# Patient Record
Sex: Female | Born: 1970 | Marital: Married | Smoking: Never smoker
Health system: Southern US, Community
[De-identification: ages and names within clinical notes are randomized; demographics above are authoritative.]

## PROBLEM LIST (undated history)

## (undated) DIAGNOSIS — F419 Anxiety disorder, unspecified: Secondary | ICD-10-CM

## (undated) DIAGNOSIS — T7840XA Allergy, unspecified, initial encounter: Secondary | ICD-10-CM

## (undated) DIAGNOSIS — R0982 Postnasal drip: Secondary | ICD-10-CM

## (undated) HISTORY — DX: Postnasal drip: R09.82

## (undated) HISTORY — DX: Anxiety disorder, unspecified: F41.9

## (undated) HISTORY — DX: Allergy, unspecified, initial encounter: T78.40XA

## (undated) HISTORY — PX: ANTERIOR CRUCIATE LIGAMENT REPAIR: SHX115

---

## 2014-03-03 ENCOUNTER — Encounter: Payer: Self-pay | Admitting: Family Medicine

## 2014-03-03 ENCOUNTER — Ambulatory Visit (INDEPENDENT_AMBULATORY_CARE_PROVIDER_SITE_OTHER): Payer: Managed Care, Other (non HMO) | Admitting: Family Medicine

## 2014-03-03 VITALS — BP 100/70 | HR 65 | Temp 97.8°F | Ht 62.5 in | Wt 121.4 lb

## 2014-03-03 DIAGNOSIS — Z7689 Persons encountering health services in other specified circumstances: Secondary | ICD-10-CM

## 2014-03-03 DIAGNOSIS — F419 Anxiety disorder, unspecified: Principal | ICD-10-CM

## 2014-03-03 DIAGNOSIS — Z23 Encounter for immunization: Secondary | ICD-10-CM

## 2014-03-03 DIAGNOSIS — Z7189 Other specified counseling: Secondary | ICD-10-CM

## 2014-03-03 DIAGNOSIS — F418 Other specified anxiety disorders: Secondary | ICD-10-CM

## 2014-03-03 DIAGNOSIS — R202 Paresthesia of skin: Secondary | ICD-10-CM

## 2014-03-03 DIAGNOSIS — F329 Major depressive disorder, single episode, unspecified: Secondary | ICD-10-CM

## 2014-03-03 DIAGNOSIS — N926 Irregular menstruation, unspecified: Secondary | ICD-10-CM

## 2014-03-03 DIAGNOSIS — F32A Depression, unspecified: Secondary | ICD-10-CM

## 2014-03-03 MED ORDER — VENLAFAXINE HCL ER 75 MG PO CP24: 75.0000 mg | ORAL_CAPSULE | Freq: Every day | ORAL | Status: DC

## 2014-03-03 NOTE — Progress Notes (Signed)
HPI:  April Perez is here to establish care. [name] but she and husbnad come here. Her PCP quit practicing about 1 year ago. Last PCP and physical:  Has the following chronic problems that require follow up and concerns today:  Pain in Hands: -on computer a lot - working more then usual -for several weeks has had some tingling, pain in thenar region, feeling weak in hands, R>L -denies: severe pain, fevers, symptoms elsewhere  Hx extreme GAD/depressive symptoms: -on effexor in the past -has had severe stress at work recently and this has been overwhelming -too much work and 44 year old that she feels like she does give her not Depression Symptoms: Sleep disorder: yes Interest deficit/anhedonia: yes Guilt (worthlessness, hopelessness, regret): yes Energy deficit: yes Concentration deficit: yes, feels like in a fog Appetite disorder: yes Psychomotor retardation or agitation: yes Suicidality: no Symptoms of GAD: Restlessness, on edge: yes Easily Fatigued: yes Difficulty concentrating: yes Irritability: yes Muscle tension yes Sleep Disturbance: yes -had counseling in the past  Irr Menstrual Bleeding: -reports sees gynecologist -irr periods  -denies: abd pain, fevers, weight loss  ROS negative for unless reported above: fevers, unintentional weight loss, hearing or vision loss, chest pain, palpitations, struggling to breath, hemoptysis, melena, hematochezia, hematuria, falls, loc, si, thoughts of self harm  Past Medical History  Diagnosis Date  . Anxiety   . Allergy   . Post-nasal drip     Past Surgical History  Procedure Laterality Date  . Anterior cruciate ligament repair Left     Family History  Problem Relation Age of Onset  . Osteoporosis Mother     and grandmother  . Hypertension Father   . Hyperlipidemia Mother   . Heart disease Maternal Grandfather     History   Social History  . Marital Status: Married     Spouse Name: N/A    Number of Children: N/A  . Years of Education: N/A   Social History Main Topics  . Smoking status: Never Smoker   . Smokeless tobacco: None  . Alcohol Use: None  . Drug Use: None  . Sexual Activity: None   Other Topics Concern  . 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  GENERAL: vitals reviewed and listed above, alert, oriented, appears well hydrated and in no acute distress  HEENT: atraumatic, conjunttiva clear, no obvious abnormalities on inspection of external nose and ears  NECK: no obvious masses on inspection  LUNGS: clear to auscultation bilaterally, no wheezes, rales or rhonchi, good air movement  CV: HRRR, no peripheral edema  MS/NEURO: moves all extremities without noticeable abnormality, CN II-XII groslly intact, finger to nose normal, visual acuity grossly intact, normal strength and sensation throughout on detailed exam of UEs bilaterally, normal DTRs, neg tinel's, phalens mildly +  R  PSYCH: pleasant and cooperative, no obvious depression or anxiety  ASSESSMENT AND PLAN:  Discussed the following assessment and plan:  Anxiety and depression -restart effexor as this worked well for her -advised CBT and exercise as well -she is looking for a new job and this may help -follow up 1 month or sooner as needed  Menstrual periods irregular -advised to schedule visit with her gynecologist Encounter to establish  care  Paresthesia of both hands -I suspect this is MS and/or CTS related to increased work load -cock up brace given for R hand - fitted and applied -follow up 1 month  She is not fasting today but wants to do FASTING labs - will do TSH, lipids, cbc, HgbA1c next visit  -We reviewed the PMH, PSH, FH, SH, Meds and Allergies. -We provided refills for any medications we will prescribe as needed. -We addressed current concerns per orders and patient instructions. -We have asked for records for pertinent exams, studies, vaccines and notes from previous providers. -We have advised patient to follow up per instructions below.   -Patient advised to return or notify a doctor immediately if symptoms worsen or persist or new concerns arise.  Patient Instructions  BEFORE YOU LEAVE: -follow up in 1 month - come fasting and we will check labs that day -flu vaccine today -medium R cock up brace  Please schedule visit with your gynecologist  FOR THE ANXIETY AND DEPRESSION: -start the Effexor 75mg  daily -schedule counseling -get regular exercise  We recommend the following healthy lifestyle measures: - eat a healthy diet consisting of lots of vegetables, fruits, beans, nuts, seeds, healthy meats such as white chicken and fish and whole grains.  - avoid fried foods, fast food, processed foods, sodas, red meet and other fattening foods.  - get a least 150 minutes of aerobic exercise per week.       Kriste BasqueKIM, Vannie Hochstetler R.

## 2014-03-03 NOTE — Patient Instructions (Addendum)
BEFORE YOU LEAVE: -follow up in 1 month - come fasting and we will check labs that day -flu vaccine today -medium R cock up brace  Please schedule visit with your gynecologist  FOR THE ANXIETY AND DEPRESSION: -start the Effexor 75mg  daily -schedule counseling -get regular exercise  We recommend the following healthy lifestyle measures: - eat a healthy diet consisting of lots of vegetables, fruits, beans, nuts, seeds, healthy meats such as white chicken and fish and whole grains.  - avoid fried foods, fast food, processed foods, sodas, red meet and other fattening foods.  - get a least 150 minutes of aerobic exercise per week.

## 2014-03-03 NOTE — Addendum Note (Signed)
Addended by: Johnella MoloneyFUNDERBURK, JO A on: 03/03/2014 10:33 AM   Modules accepted: Orders

## 2014-03-03 NOTE — Progress Notes (Signed)
Pre visit review using our clinic review tool, if applicable. No additional management support is needed unless otherwise documented below in the visit note. 

## 2014-04-07 ENCOUNTER — Ambulatory Visit (INDEPENDENT_AMBULATORY_CARE_PROVIDER_SITE_OTHER): Payer: Managed Care, Other (non HMO) | Admitting: Family Medicine

## 2014-04-07 ENCOUNTER — Ambulatory Visit: Payer: Managed Care, Other (non HMO) | Admitting: Family Medicine

## 2014-04-07 ENCOUNTER — Encounter: Payer: Self-pay | Admitting: Family Medicine

## 2014-04-07 VITALS — BP 96/70 | HR 73 | Temp 97.3°F | Ht 62.5 in | Wt 119.8 lb

## 2014-04-07 DIAGNOSIS — R300 Dysuria: Secondary | ICD-10-CM

## 2014-04-07 DIAGNOSIS — F419 Anxiety disorder, unspecified: Principal | ICD-10-CM

## 2014-04-07 DIAGNOSIS — F418 Other specified anxiety disorders: Secondary | ICD-10-CM

## 2014-04-07 DIAGNOSIS — Z13220 Encounter for screening for lipoid disorders: Secondary | ICD-10-CM

## 2014-04-07 DIAGNOSIS — G5601 Carpal tunnel syndrome, right upper limb: Secondary | ICD-10-CM

## 2014-04-07 DIAGNOSIS — F329 Major depressive disorder, single episode, unspecified: Secondary | ICD-10-CM

## 2014-04-07 DIAGNOSIS — Z131 Encounter for screening for diabetes mellitus: Secondary | ICD-10-CM

## 2014-04-07 LAB — POCT URINALYSIS DIPSTICK
BILIRUBIN UA: NEGATIVE
Blood, UA: NEGATIVE
GLUCOSE UA: NEGATIVE
NITRITE UA: POSITIVE
SPEC GRAV UA: 1.02
Urobilinogen, UA: 0.2
pH, UA: 8.5

## 2014-04-07 LAB — LIPID PANEL
CHOLESTEROL: 193 mg/dL (ref 0–200)
HDL: 48.6 mg/dL (ref >39.00)
LDL Cholesterol: 126 mg/dL — ABNORMAL HIGH (ref 0–99)
NonHDL: 144.4
Total CHOL/HDL Ratio: 4
Triglycerides: 90 mg/dL (ref 0.0–149.0)
VLDL: 18 mg/dL (ref 0.0–40.0)

## 2014-04-07 LAB — HEMOGLOBIN A1C: Hgb A1c MFr Bld: 5.2 % (ref 4.6–6.5)

## 2014-04-07 NOTE — Progress Notes (Signed)
Pre visit review using our clinic review tool, if applicable. No additional management support is needed unless otherwise documented below in the visit note. 

## 2014-04-07 NOTE — Patient Instructions (Signed)
BEFORE YOU LEAVE: -urine dip and culture if abnormal -schedule follow up in 3 months -labs  We recommend the following healthy lifestyle measures: - eat a healthy diet consisting of lots of vegetables, fruits, beans, nuts, seeds, healthy meats such as white chicken and fish and whole grains.  - avoid fried foods, fast food, processed foods, sodas, red meet and other fattening foods.  - get a least 150 minutes of aerobic exercise per week.   Consider counseling

## 2014-04-07 NOTE — Addendum Note (Signed)
Addended by: Johnella MoloneyFUNDERBURK, JO A on: 04/07/2014 08:22 AM   Modules accepted: Orders

## 2014-04-07 NOTE — Addendum Note (Signed)
Addended by: Johnella MoloneyFUNDERBURK, Alizaya Oshea A on: 04/07/2014 08:24 AM   Modules accepted: Orders

## 2014-04-07 NOTE — Progress Notes (Signed)
HPI:   Pain in Hands: -cock up brace for R given 02/2014 - improved significantly but still has some days with this if does not wear brace -on computer a lot - working more then usual - tingling, pain in thenar region, feeling weak in hands, R>L  -denies: severe pain, fevers, symptoms elsewhere  GAD/depressive symptoms: -on effexor in the past, restarted 02/2014 - reports better -stress at work -feels guilt that does not give enough time to daughter Depression Symptoms: Sleep disorder: no Interest deficit/anhedonia: no Guilt (worthlessness, hopelessness, regret): no Energy deficit: no Concentration deficit: no Appetite disorder: no Psychomotor retardation or agitation: no Suicidality: no Symptoms of GAD: Restlessness, on edge: improved Easily Fatigued: no Difficulty concentrating: no Irritability: no Muscle tension improved Sleep Disturbance: improved -had counseling in the past  Dysuria: -started 1 week ago -frequency, urgency, odor to urine, low back pain a little -denies: fevers, nausea, vomiting, hematuria -FDLMP: 2 weeks ago  Irr Menstrual Bleeding: -reports sees gynecologist -irr periods, resolved -denies: abd pain, fevers, weight loss  ROS: See pertinent positives and negatives per HPI.  Past Medical History  Diagnosis Date  . Anxiety   . Allergy   . Post-nasal drip     Past Surgical History  Procedure Laterality Date  . Anterior cruciate ligament repair Left     Family History  Problem Relation Age of Onset  . Osteoporosis Mother     and grandmother  . Hypertension Father   . Hyperlipidemia Mother   . Heart disease Maternal Grandfather     History   Social History  . Marital Status: Married    Spouse Name: N/A    Number of Children: N/A  . Years of Education: N/A   Social History Main Topics  . Smoking status: Never Smoker   . Smokeless tobacco: None  . Alcohol Use: None  . Drug Use: None  . Sexual Activity: None   Other Topics  Concern  . 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  venlafaxine XR (EFFEXOR XR) 75 MG 24 hr capsule, Take 1 capsule (75 mg total) by mouth daily with breakfast., Disp: 30 capsule, Rfl: 3  EXAM:  Filed Vitals:   04/07/14 0804  BP: 96/70  Pulse: 73  Temp: 97.3 F (36.3 C)    Body mass index is 21.55 kg/(m^2).  GENERAL: vitals reviewed and listed above, alert, oriented, appears well hydrated and in no acute distress  HEENT: atraumatic, conjunttiva clear, no obvious abnormalities on inspection of external nose and ears  NECK: no obvious masses on inspection  LUNGS: clear to auscultation bilaterally, no wheezes, rales or rhonchi, good air movement  CV: HRRR, no peripheral edema  ABD: BS+, soft, NTTP, no CVA TTP  MS: moves all extremities without noticeable abnormality  PSYCH: pleasant and cooperative, no obvious depression or anxiety  ASSESSMENT AND PLAN:  Discussed the following assessment and plan:  Anxiety and depression -cont effexor, f/u in 3 month  Carpal tunnel syndrome of right wrist -continue cub as needed  Dysuria -udip, cx if needed, neg exam, f/u if persists  Screening for hyperlipidemia - Plan: Lipid panel  Screening for  diabetes mellitus - Plan: Hemoglobin A1c  -FASTING labs: lipids, hgba1c -Patient advised to return or notify a doctor immediately if symptoms worsen or persist or new concerns arise.  Patient Instructions  BEFORE YOU LEAVE: -urine dip and culture if abnormal -schedule follow up in 3 months -labs  We recommend the following healthy lifestyle measures: - eat a healthy diet consisting of lots of vegetables, fruits, beans, nuts, seeds, healthy meats such as white chicken and  fish and whole grains.  - avoid fried foods, fast food, processed foods, sodas, red meet and other fattening foods.  - get a least 150 minutes of aerobic exercise per week.   Consider counseling     Kriste Basque R.

## 2014-04-08 MED ORDER — NITROFURANTOIN MONOHYD MACRO 100 MG PO CAPS: 100.0000 mg | ORAL_CAPSULE | Freq: Two times a day (BID) | ORAL | Status: DC

## 2014-04-08 NOTE — Addendum Note (Signed)
Addended by: Johnella MoloneyFUNDERBURK, Quasim Doyon A on: 04/08/2014 12:49 PM   Modules accepted: Orders

## 2014-04-10 ENCOUNTER — Ambulatory Visit: Payer: Managed Care, Other (non HMO) | Admitting: Family Medicine

## 2014-04-10 LAB — URINE CULTURE

## 2014-07-02 ENCOUNTER — Other Ambulatory Visit: Payer: Self-pay | Admitting: Family Medicine

## 2014-07-07 ENCOUNTER — Ambulatory Visit: Payer: Managed Care, Other (non HMO) | Admitting: Family Medicine

## 2014-07-30 ENCOUNTER — Other Ambulatory Visit: Payer: Self-pay | Admitting: Family Medicine

## 2014-07-31 ENCOUNTER — Ambulatory Visit: Payer: Managed Care, Other (non HMO) | Admitting: Family Medicine

## 2014-08-05 ENCOUNTER — Ambulatory Visit: Payer: Managed Care, Other (non HMO) | Admitting: Family Medicine

## 2014-08-20 ENCOUNTER — Ambulatory Visit: Payer: Managed Care, Other (non HMO) | Admitting: Family Medicine

## 2014-09-17 ENCOUNTER — Ambulatory Visit: Payer: Managed Care, Other (non HMO) | Admitting: Family Medicine

## 2014-10-06 ENCOUNTER — Ambulatory Visit: Payer: Managed Care, Other (non HMO) | Admitting: Family Medicine

## 2014-10-08 ENCOUNTER — Ambulatory Visit (INDEPENDENT_AMBULATORY_CARE_PROVIDER_SITE_OTHER): Payer: Self-pay | Admitting: Family Medicine

## 2014-10-08 DIAGNOSIS — R69 Illness, unspecified: Secondary | ICD-10-CM

## 2014-10-08 NOTE — Progress Notes (Signed)
NO SHOW

## 2014-10-23 ENCOUNTER — Other Ambulatory Visit: Payer: Self-pay | Admitting: Family Medicine

## 2014-12-19 ENCOUNTER — Other Ambulatory Visit: Payer: Self-pay | Admitting: Family Medicine

## 2015-02-09 ENCOUNTER — Ambulatory Visit (INDEPENDENT_AMBULATORY_CARE_PROVIDER_SITE_OTHER): Payer: PRIVATE HEALTH INSURANCE | Admitting: Family Medicine

## 2015-02-09 ENCOUNTER — Encounter: Payer: Self-pay | Admitting: Family Medicine

## 2015-02-09 VITALS — BP 98/60 | HR 83 | Temp 98.7°F | Ht 62.5 in | Wt 127.7 lb

## 2015-02-09 DIAGNOSIS — Z23 Encounter for immunization: Secondary | ICD-10-CM | POA: Diagnosis not present

## 2015-02-09 DIAGNOSIS — R1013 Epigastric pain: Secondary | ICD-10-CM | POA: Diagnosis not present

## 2015-02-09 DIAGNOSIS — K219 Gastro-esophageal reflux disease without esophagitis: Secondary | ICD-10-CM | POA: Diagnosis not present

## 2015-02-09 NOTE — Progress Notes (Signed)
HPI: Acute visit for:  Epigastric discomfort: -she has a hx of GERD and was on medication in the past -she has had some constant epigastric pain for about 1 week -she went on a short cruise after this started and was eating and drinking food she does not usually have -she denies: nausea, vomiting, diarrhea, fevers, malaise, dysphagia, change in symptoms with meals, RUQ pain, melena, hematochezia, new exercise or activity - she is active riding horses  ROS: See pertinent positives and negatives per HPI.  Past Medical History  Diagnosis Date  . Anxiety   . Allergy   . Post-nasal drip     Past Surgical History  Procedure Laterality Date  . Anterior cruciate ligament repair Left     Family History  Problem Relation Age of Onset  . Osteoporosis Mother     and grandmother  . Hypertension Father   . Hyperlipidemia Mother   . Heart disease Maternal Grandfather     Social History   Social History  . Marital Status: Married    Spouse Name: N/A  . Number of Children: N/A  . Years of Education: N/A   Social History Main Topics  . Smoking status: Never Smoker   . Smokeless tobacco: None  . Alcohol Use: None  . Drug Use: None  . Sexual Activity: Not Asked   Other Topics Concern  . 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  EXAM:  Filed Vitals:   02/09/15 1608  BP: 98/60  Pulse: 83  Temp: 98.7 F (37.1 C)    Body mass index is 22.97 kg/(m^2).  GENERAL: vitals reviewed and listed above, alert, oriented, appears well hydrated and in no acute distress  HEENT: atraumatic, conjunttiva clear, no obvious abnormalities on inspection of external nose and ears  NECK: no obvious masses on  inspection  LUNGS: clear to auscultation bilaterally, no wheezes, rales or rhonchi, good air movement  CV: HRRR, no peripheral edema  ABD: BS+, soft, no rebound or guarding, no pain in RUQ, mild TTP in epigastric region  MS: moves all extremities without noticeable abnormality, minimal TTP in abd wall muscles, no hernia appreciated on exam with valsalva  PSYCH: pleasant and cooperative, no obvious depression or anxiety  ASSESSMENT AND PLAN:  Discussed the following assessment and plan:  Needs flu shot - Plan: Flu Vaccine QUAD 36+ mos PF IM (Fluarix & Fluzone Quad PF)  Gastroesophageal reflux disease, esophagitis presence not specified  Epigastric pain  -we discussed possible serious and likely etiologies, workup and treatment, treatment risks and return precautions - suspect GERD or muscle strain most likely -after this discussion, Revella opted for short course PPI and observatio -follow up advised in 2 weeks if any symptoms persist -of course, we advised Makinzi  to return or notify a doctor immediately if symptoms worsen or persist or new concerns arise.  .  -Patient advised to return or notify a doctor immediately if symptoms worsen or persist or new concerns arise.  Patient Instructions  Nexium  daily for 2 weeks (this is available over the counter)  Please follow up if symptoms persist after treatment or there are  any other concerns  Food Choices for Gastroesophageal Reflux Disease, Adult When you have gastroesophageal reflux disease (GERD), the foods you eat and your eating habits are very important. Choosing the right foods can help ease the discomfort of GERD. WHAT GENERAL GUIDELINES DO I NEED TO FOLLOW?  Choose fruits, vegetables, whole grains, low-fat dairy products, and low-fat meat, fish, and poultry.  Limit fats such as oils, salad dressings, butter, nuts, and avocado.  Keep a food diary to identify foods that cause symptoms.  Avoid foods that cause  reflux. These may be different for different people.  Eat frequent small meals instead of three large meals each day.  Eat your meals slowly, in a relaxed setting.  Limit fried foods.  Cook foods using methods other than frying.  Avoid drinking alcohol.  Avoid drinking large amounts of liquids with your meals.  Avoid bending over or lying down until 2-3 hours after eating. WHAT FOODS ARE NOT RECOMMENDED? The following are some foods and drinks that may worsen your symptoms: Vegetables Tomatoes. Tomato juice. Tomato and spaghetti sauce. Chili peppers. Onion and garlic. Horseradish. Fruits Oranges, grapefruit, and lemon (fruit and juice). Meats High-fat meats, fish, and poultry. This includes hot dogs, ribs, ham, sausage, salami, and bacon. Dairy Whole milk and chocolate milk. Sour cream. Cream. Butter. Ice cream. Cream cheese.  Beverages Coffee and tea, with or without caffeine. Carbonated beverages or energy drinks. Condiments Hot sauce. Barbecue sauce.  Sweets/Desserts Chocolate and cocoa. Donuts. Peppermint and spearmint. Fats and Oils High-fat foods, including [place] fries and potato chips. Other Vinegar. Strong spices, such as black pepper, white pepper, red pepper, cayenne, curry powder, cloves, ginger, and chili powder. The items listed above may not be a complete list of foods and beverages to avoid. Contact your dietitian for more information.   This information is not intended to replace advice given to you by your health care provider. Make sure you discuss any questions you have with your health care provider.   Document Released: 03/06/2005 Document Revised: 03/27/2014 Document Reviewed: 01/08/2013 Elsevier Interactive Patient Education [address]2016 Elsevier Inc.      Arroy[place] HondoKIM, FrancesvilleHANNAH R.

## 2015-02-09 NOTE — Patient Instructions (Addendum)
Nexium 20mg  daily for 2 weeks (this is available over the counter)  Please follow up if symptoms persist after treatment or there are any other concerns  Food Choices for Gastroesophageal Reflux Disease, Adult When you have gastroesophageal reflux disease (GERD), the foods you eat and your eating habits are very important. Choosing the right foods can help ease the discomfort of GERD. WHAT GENERAL GUIDELINES DO I NEED TO FOLLOW?  Choose fruits, vegetables, whole grains, low-fat dairy products, and low-fat meat, fish, and poultry.  Limit fats such as oils, salad dressings, butter, nuts, and avocado.  Keep a food diary to identify foods that cause symptoms.  Avoid foods that cause reflux. These may be different for different people.  Eat frequent small meals instead of three large meals each day.  Eat your meals slowly, in a relaxed setting.  Limit fried foods.  Cook foods using methods other than frying.  Avoid drinking alcohol.  Avoid drinking large amounts of liquids with your meals.  Avoid bending over or lying down until 2-3 hours after eating. WHAT FOODS ARE NOT RECOMMENDED? The following are some foods and drinks that may worsen your symptoms: Vegetables Tomatoes. Tomato juice. Tomato and spaghetti sauce. Chili peppers. Onion and garlic. Horseradish. Fruits Oranges, grapefruit, and lemon (fruit and juice). Meats High-fat meats, fish, and poultry. This includes hot dogs, ribs, ham, sausage, salami, and bacon. Dairy Whole milk and chocolate milk. Sour cream. Cream. Butter. Ice cream. Cream cheese.  Beverages Coffee and tea, with or without caffeine. Carbonated beverages or energy drinks. Condiments Hot sauce. Barbecue sauce.  Sweets/Desserts Chocolate and cocoa. Donuts. Peppermint and spearmint. Fats and Oils High-fat foods, including [place] fries and potato chips. Other Vinegar. Strong spices, such as black pepper, white pepper, red pepper, cayenne, curry powder,  cloves, ginger, and chili powder. The items listed above may not be a complete list of foods and beverages to avoid. Contact your dietitian for more information.   This information is not intended to replace advice given to you by your health care provider. Make sure you discuss any questions you have with your health care provider.   Document Released: 03/06/2005 Document Revised: 03/27/2014 Document Reviewed: 01/08/2013 Elsevier Interactive Patient Education [employer] Elsevier Inc.

## 2015-07-14 ENCOUNTER — Other Ambulatory Visit: Payer: Self-pay | Admitting: Otolaryngology

## 2015-07-14 DIAGNOSIS — H8023 Cochlear otosclerosis, bilateral: Secondary | ICD-10-CM

## 2015-07-22 ENCOUNTER — Other Ambulatory Visit: Payer: PRIVATE HEALTH INSURANCE

## 2015-07-23 ENCOUNTER — Ambulatory Visit
Admission: RE | Admit: 2015-07-23 | Discharge: 2015-07-23 | Disposition: A | Discharge status: Home / Self Care | Payer: 59 | Source: Ambulatory Visit | Attending: Otolaryngology | Admitting: Otolaryngology

## 2015-07-23 DIAGNOSIS — H8023 Cochlear otosclerosis, bilateral: Secondary | ICD-10-CM

## 2015-07-23 MED ORDER — GADOBENATE DIMEGLUMINE 529 MG/ML IV SOLN
10.0000 mL | Freq: Once | INTRAVENOUS | Status: AC | PRN
  Administered 2015-07-23: 10 mL via INTRAVENOUS

## 2017-11-03 IMAGING — MR MR HEAD WO/W CM
10 of 11 series · 31 of 48 positions shown · IV contrast (10ml multihance)
Comparison: No prior imaging studies are available.

CLINICAL DATA: LEFT-sided hearing loss. Symptoms began 6-8 months
ago. Suspected otosclerosis of otic capsule.

EXAM:
MRI HEAD WITHOUT AND WITH CONTRAST
TECHNIQUE: Multiplanar, multiecho pulse sequences of the brain and surrounding
structures were obtained without and with intravenous contrast.
CONTRAST:  10mL MULTIHANCE GADOBENATE DIMEGLUMINE 529 MG/ML IV SOLN

[Series 2: T1 · sagittal · 5.0mm · 0.45mm/px · 4 of 21 slices shown (1 of 3)]
[im 1/21]
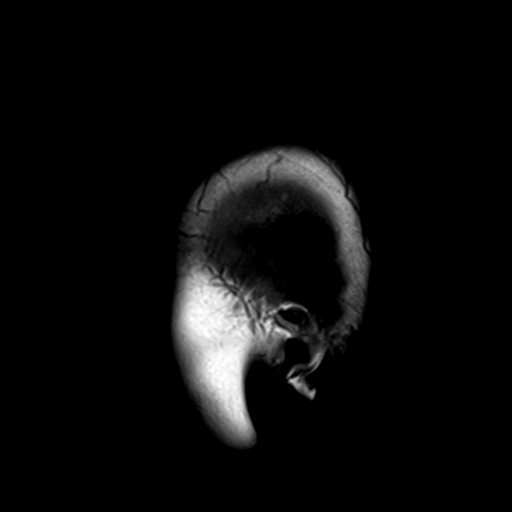
[im 7/21]
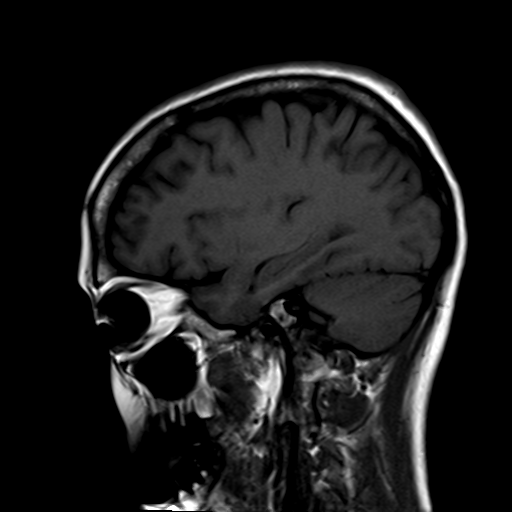
[im 14/21]
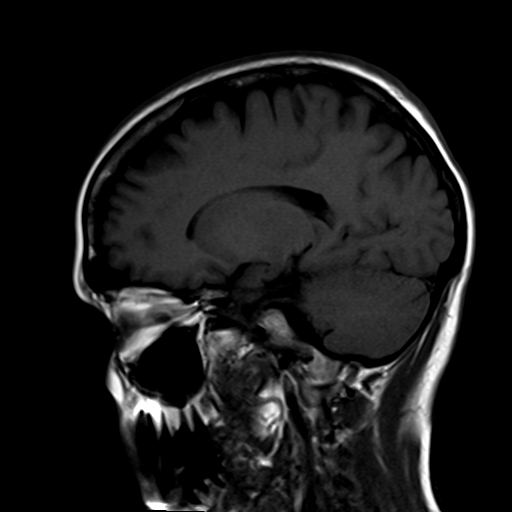
[im 21/21]
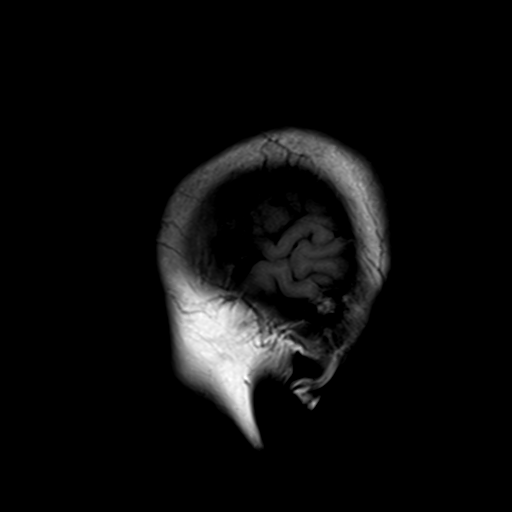

[Series 3: DWI · axial · 3.0mm · 1.80mm/px · z∈[+7,+150]mm · 8 of 97 slices shown (1 of 2)]
[im 1/97]
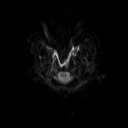
[im 15/97]
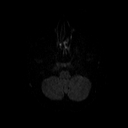
[im 30/97]
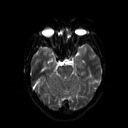
[im 45/97]
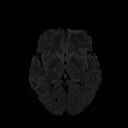
[im 52/97]
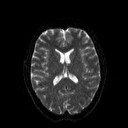
[im 67/97]
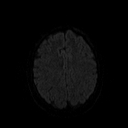
[im 82/97]
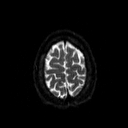
[im 97/97]
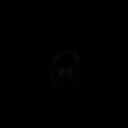

[Series 4: DWI · axial · 3.0mm · 1.80mm/px · z∈[+4,+150]mm · 6 of 49 slices shown (2 of 2)]
[im 1/49]
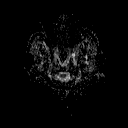
[im 10/49]
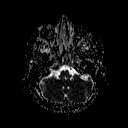
[im 20/49]
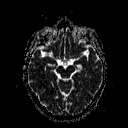
[im 29/49]
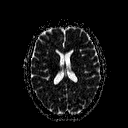
[im 39/49]
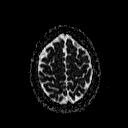
[im 49/49]
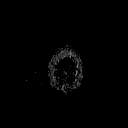

[Series 5: T2 · axial · 5.0mm · 0.45mm/px · z∈[+7,+149]mm · 3 of 23 slices shown]
[im 1/23]
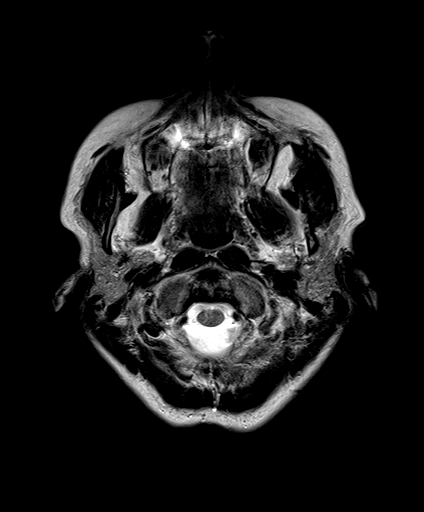
[im 12/23]
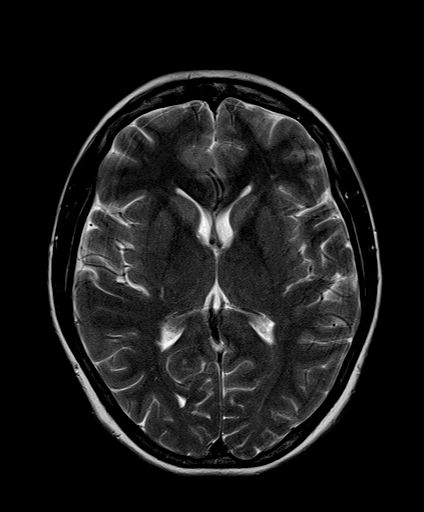
[im 23/23]
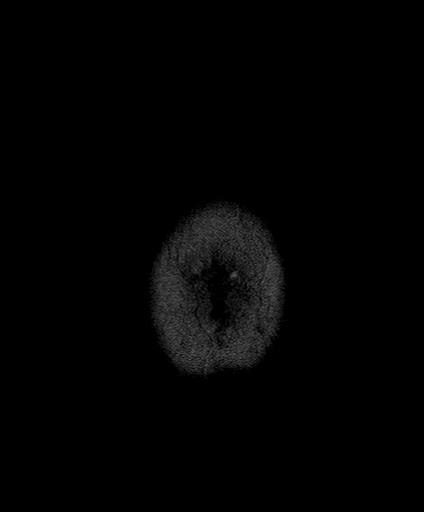

[Series 6: FLAIR · axial · 5.0mm · 0.45mm/px · z∈[+7,+149]mm · 3 of 23 slices shown]
[im 1/23]
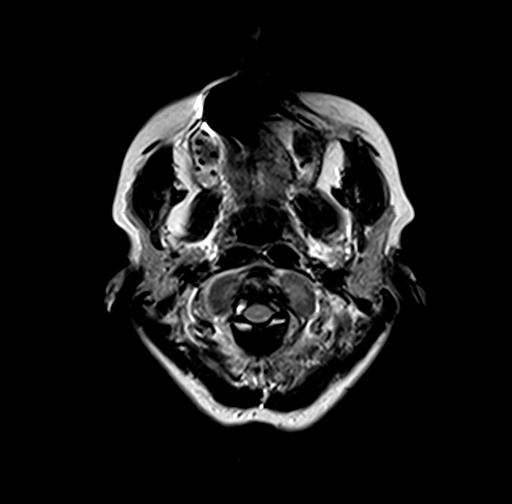
[im 12/23]
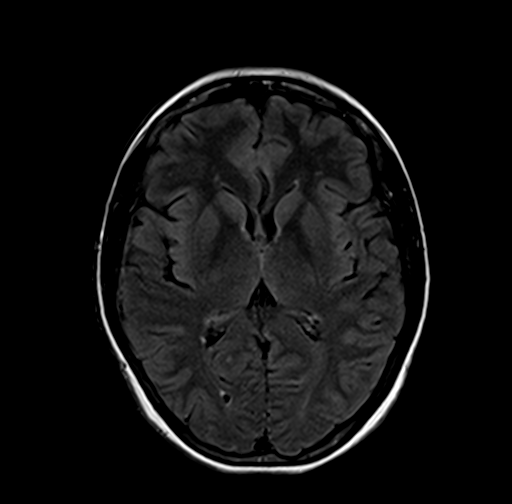
[im 23/23]
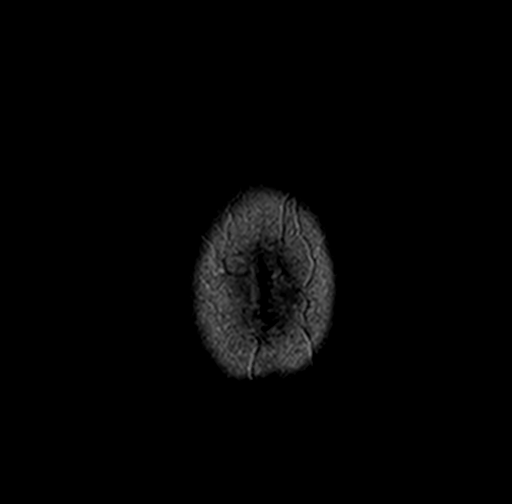

[Series 7: T1 · coronal · 3.0mm · 0.35mm/px · 1 of 11 slices shown (2 of 3)]
[im 1/11]
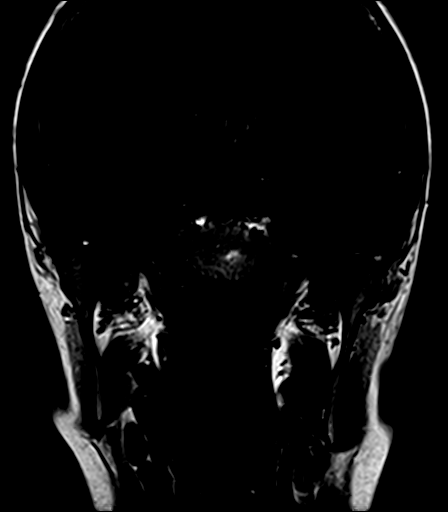

[Series 8: T1 · axial · 3.0mm · 0.35mm/px · 1 of 11 slices shown (3 of 3)]
[im 1/11]
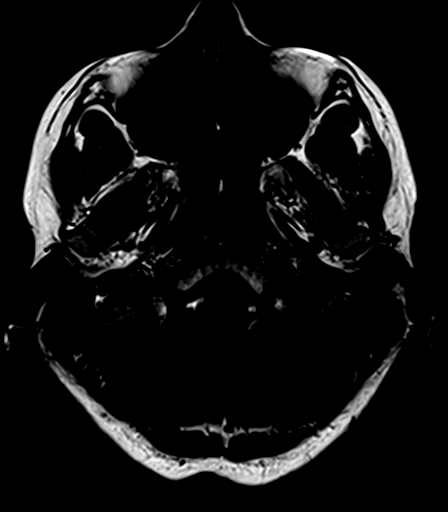

[Series 9: bSSFP · axial · 1.0mm · 0.28mm/px · z∈[+38,+55]mm · 3 of 36 slices shown]
[im 1/36]
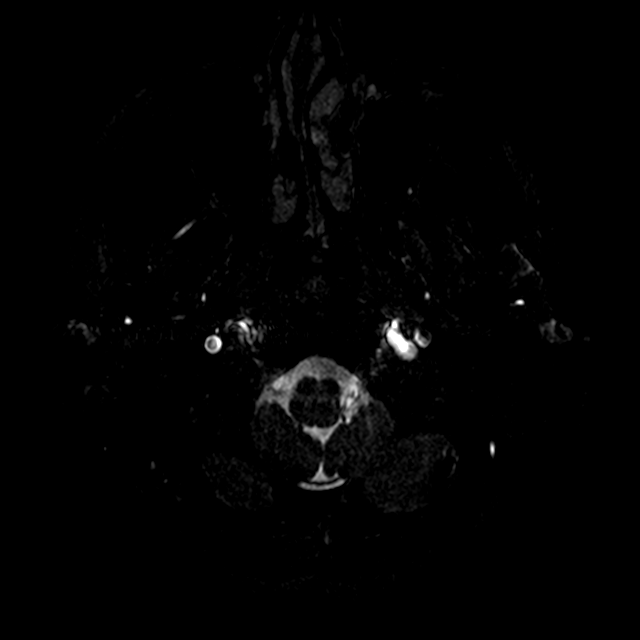
[im 9/36]
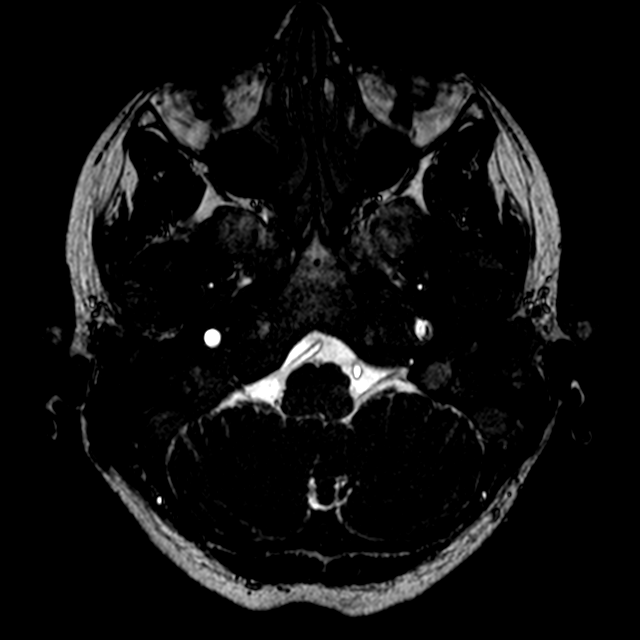
[im 18/36]
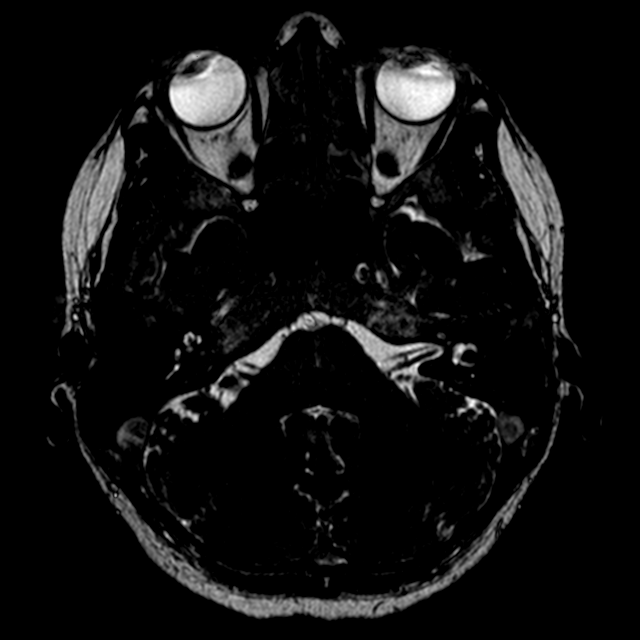

[Series 10: T1 post-contrast · coronal · 3.0mm · 0.35mm/px · 1 of 11 slices shown (1 of 2)]
[im 1/11]
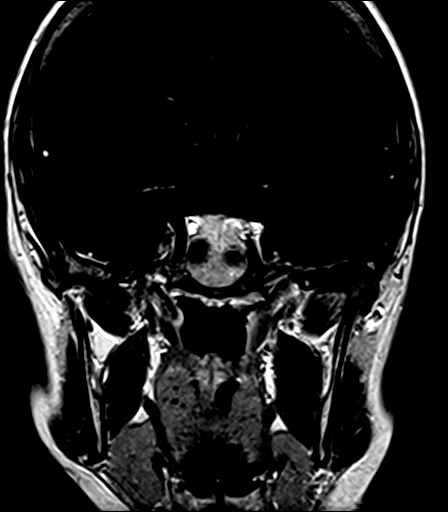

[Series 11: T1 post-contrast · axial · 3.0mm · 0.35mm/px · 1 of 11 slices shown (2 of 2)]
[im 1/11]
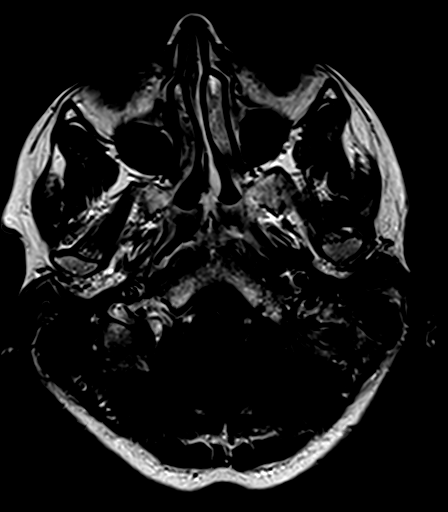

[31 of 48 positions shown; findings below may reference images not displayed]

FINDINGS: No evidence for acute infarction, hemorrhage, mass lesion,
hydrocephalus, or extra-axial fluid. Normal cerebral volume. Mild
subcortical and periventricular T2 and FLAIR hyperintensities,
nonspecific. Considerations include chronic microvascular ischemic
change, vasculitis, chronic infection, complicated migraine, or
idiopathic. Demyelinating disease is not favored.

Flow voids are maintained.  No midline abnormality.

Thin-section imaging through the posterior fossa demonstrates no
vestibular schwannoma or posterior fossa mass. No temporal bone
inflammatory process. No labyrinthine enhancement is observed.

Post infusion imaging through the entire head demonstrates no
abnormal enhancement of the brain or meninges. Major dural venous
sinuses are patent.

No acute paranasal sinus disease.  Negative orbits.
IMPRESSION: Negative exam.  No acute findings.

No retrocochlear lesion is observed.

## 2021-12-16 ENCOUNTER — Other Ambulatory Visit: Payer: Self-pay | Admitting: Physician Assistant

## 2021-12-16 DIAGNOSIS — R590 Localized enlarged lymph nodes: Secondary | ICD-10-CM

## 2021-12-22 ENCOUNTER — Other Ambulatory Visit: Payer: 59

## 2021-12-23 ENCOUNTER — Ambulatory Visit
Admission: RE | Admit: 2021-12-23 | Discharge: 2021-12-23 | Disposition: A | Discharge status: Home / Self Care | Payer: No Typology Code available for payment source | Source: Ambulatory Visit | Attending: Physician Assistant | Admitting: Physician Assistant

## 2021-12-23 DIAGNOSIS — R590 Localized enlarged lymph nodes: Secondary | ICD-10-CM

## 2021-12-30 ENCOUNTER — Other Ambulatory Visit: Payer: Self-pay | Admitting: Physician Assistant

## 2021-12-30 DIAGNOSIS — R221 Localized swelling, mass and lump, neck: Secondary | ICD-10-CM

## 2022-01-02 ENCOUNTER — Other Ambulatory Visit (HOSPITAL_COMMUNITY): Payer: Self-pay | Admitting: Physician Assistant

## 2022-01-02 DIAGNOSIS — R221 Localized swelling, mass and lump, neck: Secondary | ICD-10-CM

## 2022-01-05 ENCOUNTER — Ambulatory Visit
Admission: RE | Admit: 2022-01-05 | Discharge: 2022-01-05 | Disposition: A | Discharge status: Home / Self Care | Payer: No Typology Code available for payment source | Source: Ambulatory Visit | Attending: Physician Assistant | Admitting: Physician Assistant

## 2022-01-05 DIAGNOSIS — R221 Localized swelling, mass and lump, neck: Secondary | ICD-10-CM

## 2022-01-05 MED ORDER — IOPAMIDOL (ISOVUE-300) INJECTION 61%
75.0000 mL | Freq: Once | INTRAVENOUS | Status: AC | PRN
  Administered 2022-01-05: 75 mL via INTRAVENOUS

## 2022-01-09 NOTE — Progress Notes (Unsigned)
April Rude, MD  Donita Brooks D Approved for [place] core biopsy        Previous Messages

## 2022-01-19 ENCOUNTER — Other Ambulatory Visit: Payer: Self-pay | Admitting: Radiology

## 2022-01-23 ENCOUNTER — Ambulatory Visit (HOSPITAL_COMMUNITY)
Admission: RE | Admit: 2022-01-23 | Discharge: 2022-01-23 | Disposition: A | Discharge status: Home / Self Care | Payer: No Typology Code available for payment source | Source: Ambulatory Visit | Attending: Physician Assistant | Admitting: Physician Assistant

## 2022-01-23 ENCOUNTER — Encounter (HOSPITAL_COMMUNITY): Payer: Self-pay
